# Patient Record
Sex: Male | Born: 1970 | Race: Black or African American | Hispanic: No | Smoking: Current every day smoker
Health system: Southern US, Community
[De-identification: ages and names within clinical notes are randomized; demographics above are authoritative.]

---

## 2005-08-30 ENCOUNTER — Emergency Department: Payer: Self-pay | Admitting: Emergency Medicine

## 2005-08-31 ENCOUNTER — Ambulatory Visit: Payer: Self-pay | Admitting: Urology

## 2016-07-06 ENCOUNTER — Emergency Department: Payer: Self-pay

## 2016-07-06 ENCOUNTER — Encounter: Payer: Self-pay | Admitting: *Deleted

## 2016-07-06 DIAGNOSIS — Y9389 Activity, other specified: Secondary | ICD-10-CM | POA: Insufficient documentation

## 2016-07-06 DIAGNOSIS — F17200 Nicotine dependence, unspecified, uncomplicated: Secondary | ICD-10-CM | POA: Insufficient documentation

## 2016-07-06 DIAGNOSIS — Y999 Unspecified external cause status: Secondary | ICD-10-CM | POA: Insufficient documentation

## 2016-07-06 DIAGNOSIS — Y929 Unspecified place or not applicable: Secondary | ICD-10-CM | POA: Insufficient documentation

## 2016-07-06 DIAGNOSIS — W2209XA Striking against other stationary object, initial encounter: Secondary | ICD-10-CM | POA: Insufficient documentation

## 2016-07-06 DIAGNOSIS — S60222A Contusion of left hand, initial encounter: Secondary | ICD-10-CM | POA: Insufficient documentation

## 2016-07-06 NOTE — ED Triage Notes (Signed)
Pt has left hand pain.  Pt injured hand working on a car 2 days ago.  No deformity noted.

## 2016-07-07 ENCOUNTER — Emergency Department
Admission: EM | Admit: 2016-07-07 | Discharge: 2016-07-07 | Disposition: A | Discharge status: Home / Self Care | Payer: Self-pay | Attending: Emergency Medicine | Admitting: Emergency Medicine

## 2016-07-07 ENCOUNTER — Encounter: Payer: Self-pay | Admitting: Emergency Medicine

## 2016-07-07 DIAGNOSIS — S6992XA Unspecified injury of left wrist, hand and finger(s), initial encounter: Secondary | ICD-10-CM

## 2016-07-07 DIAGNOSIS — S60222A Contusion of left hand, initial encounter: Secondary | ICD-10-CM

## 2016-07-07 MED ORDER — TRAMADOL HCL 50 MG PO TABS: 50.0000 mg | ORAL_TABLET | Freq: Four times a day (QID) | ORAL | 0 refills | Status: AC | PRN

## 2016-07-07 MED ORDER — KETOROLAC TROMETHAMINE 60 MG/2ML IM SOLN
60.0000 mg | Freq: Once | INTRAMUSCULAR | Status: AC
  Administered 2016-07-07: 60 mg via INTRAMUSCULAR
  Filled 2016-07-07: qty 2

## 2016-07-07 NOTE — Discharge Instructions (Signed)
Please ice and elevate your hand. Please follow up with orthopedics if the pain persists for further evaluation. Please return with any other concerns

## 2016-07-07 NOTE — ED Provider Notes (Addendum)
[facility] Emergency Department Provider Note   ____________________________________________   First MD Initiated Contact with Patient 07/07/16 0140     (approximate)  I have reviewed the triage vital signs and the nursing notes.   HISTORY  Chief Complaint Hand Injury    HPI Gerald White is a 46 y.o. male who comes into the hospital todaywith some left hand pain. He reports that he can't a fist with his left hand. He was working on a car a couple of days ago when a wrench slipped off a bolt and he knocked his hand into the car. He reports that it has been painful since then. The patient has not iced his hand nor has he taken anything for pain. The patient reports that this pain is a 6 out of 10 in intensity currently whenever he tries to make a fist it is a 10 out of 10. He reports that he can force it but he can't do it on its own because of pain. The patient has some tingling to his fourth and fifth digits on the left hand. He is here today for evaluation.   History reviewed. No pertinent past medical history.  There are no active problems to display for this patient.   History reviewed. No pertinent surgical history.  Prior to Admission medications   Medication Sig Start Date End Date Taking? Authorizing Provider  traMADol (ULTRAM) 50 MG tablet Take 1 tablet (50 mg total) by mouth every 6 (six) hours as needed. 07/07/16   Rebecka Apley, MD    Allergies Patient has no known allergies.  No family history on file.  Social History Social History  Substance Use Topics  . Smoking status: Current Every Day Smoker  . Smokeless tobacco: Never Used  . Alcohol use No    Review of Systems Constitutional: No fever/chills Eyes: No visual changes. ENT: No sore throat. Cardiovascular: Denies chest pain. Respiratory: Denies shortness of breath. Gastrointestinal: No abdominal pain.  No nausea, no vomiting.  No diarrhea.  No  constipation. Genitourinary: Negative for dysuria. Musculoskeletal: left hand pain Skin: Negative for rash. Neurological: Negative for headaches, focal weakness or numbness.  10-point ROS otherwise negative.  ____________________________________________   PHYSICAL EXAM:  VITAL SIGNS: ED Triage Vitals  Enc Vitals Group     BP 07/06/16 2344 130/70     Pulse Rate 07/06/16 2342 72     Resp 07/06/16 2342 18     Temp 07/06/16 2342 98.6 F (37 C)     Temp Source 07/06/16 2342 Oral     SpO2 07/06/16 2342 98 %     Weight 07/06/16 2343 250 lb (113.4 kg)     Height 07/06/16 2343  (1.905 m)     Head Circumference --      Peak Flow --      Pain Score 07/06/16 2342 10     Pain Loc --      Pain Edu? --      Excl. in GC? --     Constitutional: Alert and oriented. Well appearing and in moderate distress. Eyes: Conjunctivae are normal. PERRL. EOMI. Head: Atraumatic. Nose: No congestion/rhinnorhea. Mouth/Throat: Mucous membranes are moist.  Oropharynx non-erythematous. Cardiovascular: Normal rate, regular rhythm. Grossly normal heart sounds.  Good peripheral circulation. Respiratory: Normal respiratory effort.  No retractions. Lungs CTAB. Gastrointestinal: Soft and nontender. No distention. Positive bowel sounds Musculoskeletal: Tenderness to palpation of left carpal, pain to passive range of motion of the 4th and  5th digits No lower extremity tenderness nor edema.  . Neurologic:  Normal speech and language.  Skin:  Skin is warm, dry dried abrasion to left 4th and 5th knuckles.  Psychiatric: Mood and affect are normal.   ____________________________________________   LABS (all labs ordered are listed, but only abnormal results are displayed)  Labs Reviewed - No data to display ____________________________________________  EKG  none ____________________________________________  RADIOLOGY  DG left  hang ____________________________________________   PROCEDURES  Procedure(s) performed: please, see procedure note(s).  .Splint Application Date/Time: 07/07/2016 2:25 AM Performed by: Rebecka Apley Authorized by: Rebecka Apley   Consent:    Consent obtained:  Verbal   Consent given by:  Patient Pre-procedure details:    Sensation:  Normal Procedure details:    Laterality:  Left   Location:  Hand   Hand:  L hand   Cast type:  Short arm   Splint type:  Ulnar gutter   Supplies:  Ortho-Glass Post-procedure details:    Pain:  Unchanged   Sensation:  Normal   Patient tolerance of procedure:  Tolerated well, no immediate complications    Critical Care performed: No  ____________________________________________   INITIAL IMPRESSION / ASSESSMENT AND PLAN / ED COURSE  Pertinent labs & imaging results that were available during my care of the patient were reviewed by me and considered in my medical decision making (see chart for details).  This is a 46 year old male who comes into the hospital today with an injury to his left hand. The patient has a dried abrasion to his knuckle but has pain to palpation of the fifth metacarpal. The patient's x-ray does not show any fracture but I did place him in a splint. I gave him a shot of Toradol and he will be discharged to follow-up with orthopedic surgery if this pain persists over the next few days. He has been encouraged to rest and ice his hand as well as take some medications. The patient has no further concerns and he'll be discharged home.  Clinical Course as of Jul 07 229  Tue Jul 07, 2016  0130 No acute fracture or malalignment. DG Hand Complete Left [AW]    Clinical Course User Index [AW] Rebecka Apley, MD     ____________________________________________   FINAL CLINICAL IMPRESSION(S) / ED DIAGNOSES  Final diagnoses:  Contusion of left hand, initial encounter  Injury of left hand, initial encounter       NEW MEDICATIONS STARTED DURING THIS VISIT:  New Prescriptions   TRAMADOL (ULTRAM) 50 MG TABLET    Take 1 tablet (50 mg total) by mouth every 6 (six) hours as needed.     Note:  This document was prepared using Dragon voice recognition software and may include unintentional dictation errors.    Rebecka Apley, MD 07/07/16 0240    Rebecka Apley, MD 07/07/16 [phone]

## 2016-08-14 ENCOUNTER — Emergency Department
Admission: EM | Admit: 2016-08-14 | Discharge: 2016-08-14 | Disposition: A | Discharge status: Home / Self Care | Payer: No Typology Code available for payment source | Attending: Emergency Medicine | Admitting: Emergency Medicine

## 2016-08-14 ENCOUNTER — Emergency Department: Payer: No Typology Code available for payment source

## 2016-08-14 DIAGNOSIS — F17200 Nicotine dependence, unspecified, uncomplicated: Secondary | ICD-10-CM | POA: Insufficient documentation

## 2016-08-14 DIAGNOSIS — Y92488 Other paved roadways as the place of occurrence of the external cause: Secondary | ICD-10-CM | POA: Insufficient documentation

## 2016-08-14 DIAGNOSIS — Y9389 Activity, other specified: Secondary | ICD-10-CM | POA: Diagnosis not present

## 2016-08-14 DIAGNOSIS — M25512 Pain in left shoulder: Secondary | ICD-10-CM | POA: Diagnosis not present

## 2016-08-14 DIAGNOSIS — M25532 Pain in left wrist: Secondary | ICD-10-CM | POA: Insufficient documentation

## 2016-08-14 DIAGNOSIS — M542 Cervicalgia: Secondary | ICD-10-CM | POA: Diagnosis not present

## 2016-08-14 DIAGNOSIS — Y998 Other external cause status: Secondary | ICD-10-CM | POA: Insufficient documentation

## 2016-08-14 MED ORDER — NAPROXEN 500 MG PO TABS
500.0000 mg | ORAL_TABLET | Freq: Once | ORAL | Status: AC
  Administered 2016-08-14: 500 mg via ORAL
  Filled 2016-08-14: qty 1

## 2016-08-14 MED ORDER — MELOXICAM 7.5 MG PO TABS: 7.5000 mg | ORAL_TABLET | Freq: Every day | ORAL | 1 refills | Status: AC

## 2016-08-14 MED ORDER — CYCLOBENZAPRINE HCL 5 MG PO TABS: 5.0000 mg | ORAL_TABLET | Freq: Three times a day (TID) | ORAL | 0 refills | Status: AC | PRN
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
## 2016-08-14 NOTE — ED Notes (Signed)
ED Provider at bedside. 
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
